# Patient Record
Sex: Male | Born: 1995 | Race: White | Hispanic: No | Marital: Single
Health system: Southern US, Community
[De-identification: ages and names within clinical notes are randomized; demographics above are authoritative.]

## PROBLEM LIST (undated history)

## (undated) DIAGNOSIS — D229 Melanocytic nevi, unspecified: Secondary | ICD-10-CM

## (undated) DIAGNOSIS — J302 Other seasonal allergic rhinitis: Secondary | ICD-10-CM

## (undated) HISTORY — PX: MULTIPLE TOOTH EXTRACTIONS: SHX2053

## (undated) HISTORY — DX: Melanocytic nevi, unspecified: D22.9

## (undated) HISTORY — PX: MOLE REMOVAL: SHX2046

## (undated) HISTORY — DX: Other seasonal allergic rhinitis: J30.2

## (undated) HISTORY — PX: CIRCUMCISION: SUR203

---
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
## 2017-03-31 ENCOUNTER — Ambulatory Visit: Payer: Managed Care, Other (non HMO) | Admitting: Family Medicine

## 2017-03-31 ENCOUNTER — Encounter: Payer: Self-pay | Admitting: Family Medicine

## 2017-03-31 VITALS — BP 98/60 | HR 64 | Temp 98.6°F | Ht 63.0 in | Wt 137.4 lb

## 2017-03-31 DIAGNOSIS — S86911A Strain of unspecified muscle(s) and tendon(s) at lower leg level, right leg, initial encounter: Secondary | ICD-10-CM

## 2017-03-31 NOTE — Patient Instructions (Signed)
Knee Exercises Ask your health care provider which exercises are safe for you. Do exercises exactly as told by your health care provider and adjust them as directed. It is normal to feel mild stretching, pulling, tightness, or discomfort as you do these exercises, but you should stop right away if you feel sudden pain or your pain gets worse.Do not begin these exercises until told by your health care provider. STRETCHING AND RANGE OF MOTION EXERCISES These exercises warm up your muscles and joints and improve the movement and flexibility of your knee. These exercises also help to relieve pain, numbness, and tingling. Exercise A: Knee Extension, Prone 1. Lie on your abdomen on a bed. 2. Place your left / right knee just beyond the edge of the surface so your knee is not on the bed. You can put a towel under your left / right thigh just above your knee for comfort. 3. Relax your leg muscles and allow gravity to straighten your knee. You should feel a stretch behind your left / right knee. 4. Hold this position for ____5______ seconds. 5. Scoot up so your knee is supported between repetitions. Repeat ______10____ times. Complete this stretch _____2_____ times a day. Exercise B: Knee Flexion, Active  1. Lie on your back with both knees straight. If this causes back discomfort, bend your left / right knee so your foot is flat on the floor. 2. Slowly slide your left / right heel back toward your buttocks until you feel a gentle stretch in the front of your knee or thigh. 3. Hold this position for ____5______ seconds. 4. Slowly slide your left / right heel back to the starting position. Repeat _____10____ times. Complete this exercise _____2_____ times a day. Exercise C: Quadriceps, Prone  1. Lie on your abdomen on a firm surface, such as a bed or padded floor. 2. Bend your left / right knee and hold your ankle. If you cannot reach your ankle or pant leg, loop a belt around your foot and grab the belt  instead. 3. Gently pull your heel toward your buttocks. Your knee should not slide out to the side. You should feel a stretch in the front of your thigh and knee. 4. Hold this position for _____5_____ seconds. Repeat ______10____ times. Complete this stretch ______2____ times a day. Exercise D: Hamstring, Supine 1. Lie on your back. 2. Loop a belt or towel over the ball of your left / right foot. The ball of your foot is on the walking surface, right under your toes. 3. Straighten your left / right knee and slowly pull on the belt to raise your leg until you feel a gentle stretch behind your knee. ? Do not let your left / right knee bend while you do this. ? Keep your other leg flat on the floor. 4. Hold this position for _____5_____ seconds. Repeat ______10___ times. Complete this stretch ____2_____ times a day. STRENGTHENING EXERCISES These exercises build strength and endurance in your knee. Endurance is the ability to use your muscles for a long time, even after they get tired. Exercise E: Quadriceps, Isometric  1. Lie on your back with your left / right leg extended and your other knee bent. Put a rolled towel or small pillow under your knee if told by your health care provider. 2. Slowly tense the muscles in the front of your left / right thigh. You should see your kneecap slide up toward your hip or see increased dimpling just above the knee. This  motion will push the back of the knee toward the floor. 3. For _____5_____ seconds, keep the muscle as tight as you can without increasing your pain. 4. Relax the muscles slowly and completely. Repeat ____10______ times. Complete this exercise ____2______ times a day. Exercise F: Straight Leg Raises - Quadriceps 1. Lie on your back with your left / right leg extended and your other knee bent. 2. Tense the muscles in the front of your left / right thigh. You should see your kneecap slide up or see increased dimpling just above the knee. Your  thigh may even shake a bit. 3. Keep these muscles tight as you raise your leg 4-6 inches (10-15 cm) off the floor. Do not let your knee bend. 4. Hold this position for ____5______ seconds. 5. Keep these muscles tense as you lower your leg. 6. Relax your muscles slowly and completely after each repetition. Repeat ____10______ times. Complete this exercise ____2______ times a day. Exercise G: Hamstring, Isometric 1. Lie on your back on a firm surface. 2. Bend your left / right knee approximately ___120_______ degrees. 3. Dig your left / right heel into the surface as if you are trying to pull it toward your buttocks. Tighten the muscles in the back of your thighs to dig as hard as you can without increasing any pain. 4. Hold this position for ______5-7____ seconds. 5. Release the tension gradually and allow your muscles to relax completely for __________ seconds after each repetition. Repeat _____10_____ times. Complete this exercise ____2______ times a day. Exercise H: Hamstring Curls  If told by your health care provider, do this exercise while wearing ankle weights. Begin with ____2lsb.______ weights. Then increase the weight by 1 lb (0.5 kg) increments. Do not wear ankle weights that are more than ____2lbs______. 1. Lie on your abdomen with your legs straight. 2. Bend your left / right knee as far as you can without feeling pain. Keep your hips flat against the floor. 3. Hold this position for ____5______ seconds. 4. Slowly lower your leg to the starting position.  Repeat ___10_____ times. Complete this exercise ____2______ times a day. Exercise I: Squats (Quadriceps) 1. Stand in front of a table, with your feet and knees pointing straight ahead. You may rest your hands on the table for balance but not for support. 2. Slowly bend your knees and lower your hips like you are going to sit in a chair. ? Keep your weight over your heels, not over your toes. ? Keep your lower legs upright so  they are parallel with the table legs. ? Do not let your hips go lower than your knees. ? Do not bend lower than told by your health care provider. ? If your knee pain increases, do not bend as low. 3. Hold the squat position for ____5______ seconds. 4. Slowly push with your legs to return to standing. Do not use your hands to pull yourself to standing. Repeat _______10___ times. Complete this exercise ___2_______ times a day. Exercise J: Wall Slides (Quadriceps)  1. Lean your back against a smooth wall or door while you walk your feet out 18-24 inches (46-61 cm) from it. 2. Place your feet hip-width apart. 3. Slowly slide down the wall or door until your knees bend ____75______ degrees. Keep your knees over your heels, not over your toes. Keep your knees in line with your hips. 4. Hold for ____5______ seconds. Repeat _____10_____ times. Complete this exercise ______2____ times a day. Exercise K: Straight Leg Raises -  Hip Abductors 1. Lie on your side with your left / right leg in the top position. Lie so your head, shoulder, knee, and hip line up. You may bend your bottom knee to help you keep your balance. 2. Roll your hips slightly forward so your hips are stacked directly over each other and your left / right knee is facing forward. 3. Leading with your heel, lift your top leg 4-6 inches (10-15 cm). You should feel the muscles in your outer hip lifting. ? Do not let your foot drift forward. ? Do not let your knee roll toward the ceiling. 4. Hold this position for ____5______ seconds. 5. Slowly return your leg to the starting position. 6. Let your muscles relax completely after each repetition. Repeat ________10__ times. Complete this exercise ____2______ times a day. Exercise L: Straight Leg Raises - Hip Extensors 1. Lie on your abdomen on a firm surface. You can put a pillow under your hips if that is more comfortable. 2. Tense the muscles in your buttocks and lift your left / right  leg about 4-6 inches (10-15 cm). Keep your knee straight as you lift your leg. 3. Hold this position for ____5______ seconds. 4. Slowly lower your leg to the starting position. 5. Let your leg relax completely after each repetition. Repeat ___10_______ times. Complete this exercise __2________ times a day. This information is not intended to replace advice given to you by your health care provider. Make sure you discuss any questions you have with your health care provider. Document Released: 12/04/2004 Document Revised: 10/15/2015 Document Reviewed: 11/26/2014 Elsevier Interactive Patient Education  2018 [employer]. How to Use a Knee Brace A knee brace is a device that you wear to support your knee, especially if the knee is healing after an injury or surgery. There are several types of knee braces. Some are designed to prevent an injury (prophylactic brace). These are often worn during sports. Others support an injured knee (functional brace) or keep it still while it heals (rehabilitative brace). People with severe arthritis of the knee may benefit from a brace that takes some pressure off the knee (unloader brace). Most knee braces are made from a combination of cloth and metal or plastic. You may need to wear a knee brace to:  Relieve knee pain.  Help your knee support your weight (improve stability).  Help you walk farther (improve mobility).  Prevent injury.  Support your knee while it heals from surgery or from an injury.  What are the risks? Generally, knee braces are very safe to wear. However, problems may occur, including:  Skin irritation that may lead to infection.  Making your condition worse if you wear the brace in the wrong way.  How to use a knee brace Different braces will have different instructions for use. Your health care provider will tell you or show you:  How to put on your brace.  How to adjust the brace.  When and how often to wear the brace.  How  to remove the brace.  If you will need any assistive devices in addition to the brace, such as crutches or a cane.  In general, your brace should:  Have the hinge of the brace line up with the bend of your knee.  Have straps, hooks, or tapes that fasten snugly around your leg.  Not feel too tight or too loose.  How to care for a knee brace  Check your brace often for signs of damage, such as  loose connections or attachments. Your knee brace may get damaged or wear out during normal use.  Wash the fabric parts of your brace with soap and water.  Read the insert that comes with your brace for other specific care instructions. Contact a health care provider if:  Your knee brace is too loose or too tight and you cannot adjust it.  Your knee brace causes skin redness, swelling, bruising, or irritation.  Your knee brace is not helping.  Your knee brace is making your knee pain worse. This information is not intended to replace advice given to you by your health care provider. Make sure you discuss any questions you have with your health care provider. Document Released: 04/12/2003 Document Revised: 06/28/2015 Document Reviewed: 05/15/2014 Elsevier Interactive Patient Education  [employer].

## 2017-03-31 NOTE — Progress Notes (Signed)
Patient: Cameron Day Male    DOB: [birth year]   22 y.o.   MRN: [id number] Visit Date: 03/31/2017  Today's Provider: Vernie Murders, PA   Chief Complaint  Patient presents with  . Knee Pain   Subjective:    Knee Pain   Incident onset: Thursday. The injury mechanism is unknown. The pain is present in the right knee. The quality of the pain is described as shooting and cramping. The pain is at a severity of 8/10. The pain has been constant since onset. Associated symptoms include muscle weakness. The symptoms are aggravated by weight bearing (walking). He has tried elevation, ice and NSAIDs for the symptoms. The treatment provided mild relief.   Past Medical History:  Diagnosis Date  . Mole of skin   . Seasonal allergies    Patient Active Problem List   Diagnosis Date Noted  . Allergic rhinitis, seasonal 05/24/2014  . Short stature 06/21/2012  . 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 ADHD (attention deficit hyperactivity disorder) 06/21/2012   Past Surgical History:  Procedure Laterality Date  . CIRCUMCISION    . MOLE REMOVAL    . MULTIPLE TOOTH EXTRACTIONS     8 teeth  . MYRINGOTOMY WITH TUBE PLACEMENT  1999  . tubes in ears  1999   Family History  Problem Relation Age of Onset  . Diabetes Father   . Healthy Sister   . Hypertension Maternal Grandmother   . Pancreatic cancer Paternal Grandmother   . Lung cancer Paternal Grandfather    No Known Allergies  Current Outpatient Medications:  .  loratadine-pseudoephedrine (CLARITIN-D 12-HOUR) 5-120 MG per tablet, Take 1 tablet by mouth 2 (two) times daily as needed., Disp: , Rfl:  .  MULTIPLE VITAMIN MT, Take 1 tablet by mouth daily., Disp: , Rfl:   Review of Systems  Constitutional: Negative.   Respiratory: Negative.   Cardiovascular: Negative.   Musculoskeletal:       Right knee pain    Social History   Tobacco Use  . Smoking status: Passive Smoke Exposure - Never Smoker  . Smokeless tobacco: Never Used    Substance Use Topics  . Alcohol use: No   Objective:   BP 98/60 (BP Location: Right Arm, Patient Position: Sitting, Cuff Size: Normal)   Pulse 64   Temp 98.6 F (37 C) (Oral)   Ht 5\' 3"  (1.6 m)   Wt 137 lb 6.4 oz (62.3 kg)   SpO2 97%   BMI 24.34 kg/m   Physical Exam  Constitutional: He is oriented to person, place, and time. He appears well-developed and well-nourished. No distress.  HENT:  Head: Normocephalic and atraumatic.  Right Ear: Hearing normal.  Left Ear: Hearing normal.  Nose: Nose normal.  Eyes: Conjunctivae and lids are normal. Right eye exhibits no discharge. Left eye exhibits no discharge. No scleral icterus.  Pulmonary/Chest: Effort normal. No respiratory distress.  Musculoskeletal: Normal range of motion. He exhibits no edema, tenderness or deformity.  Neurological: He is alert and oriented to person, place, and time.  Skin: Skin is intact. No lesion and no rash noted.  Psychiatric: He has a normal mood and affect. His speech is normal and behavior is normal. Thought content normal.      Assessment & Plan:     1. Strain of right knee, initial encounter Uncertain as to when discomfort started, but having episode of aching pain the past few days. No specific injury identified. Took Ibuprofen this morning and feel  no pain now. Suspect knee strain and may use Neoprene brace with patellar window and rehab exercises. Recheck if pain returns or worsens.      Vernie Murders, PA  [facility]
